# Patient Record
Sex: Female | Born: 1975 | Race: White | Hispanic: No | Marital: Single | Smoking: Never smoker
Health system: Southern US, Community
[De-identification: ages and names within clinical notes are randomized; demographics above are authoritative.]

---

## 2007-01-28 ENCOUNTER — Emergency Department (HOSPITAL_COMMUNITY): Admission: EM | Admit: 2007-01-28 | Discharge: 2007-01-28 | Payer: Self-pay | Admitting: Emergency Medicine

## 2007-02-06 ENCOUNTER — Emergency Department (HOSPITAL_COMMUNITY): Admission: EM | Admit: 2007-02-06 | Discharge: 2007-02-06 | Payer: Self-pay | Admitting: Emergency Medicine
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
## 2009-07-18 ENCOUNTER — Emergency Department: Payer: Self-pay | Admitting: Emergency Medicine

## 2009-10-06 ENCOUNTER — Ambulatory Visit: Payer: Self-pay | Admitting: Family Medicine

## 2011-01-05 LAB — URINE MICROSCOPIC-ADD ON

## 2011-01-05 LAB — COMPREHENSIVE METABOLIC PANEL
AST: 64 — ABNORMAL HIGH
Albumin: 4
BUN: 11
CO2: 25
Calcium: 9.3
Creatinine, Ser: 0.58
GFR calc non Af Amer: >60
Total Bilirubin: 0.4
Total Protein: 6.7

## 2011-01-05 LAB — ETHANOL: Alcohol, Ethyl (B): 97 — ABNORMAL HIGH

## 2011-01-05 LAB — CBC
MCV: 93.9
RDW: 13.2

## 2011-01-05 LAB — URINALYSIS, ROUTINE W REFLEX MICROSCOPIC
Leukocytes, UA: NEGATIVE
pH: 5.5

## 2011-01-05 LAB — I-STAT 8, (EC8 V) (CONVERTED LAB)
Acid-base deficit: 3 — ABNORMAL HIGH
BUN: 13
Bicarbonate: 22
Chloride: 107
Glucose, Bld: 85
HCT: 43
Hemoglobin: 14.6
Operator id: 196461
Potassium: 4
Sodium: 138
TCO2: 23
pCO2, Ven: 37.5 — ABNORMAL LOW
pH, Ven: 7.376 — ABNORMAL HIGH

## 2011-01-05 LAB — DIFFERENTIAL
Basophils Absolute: 0
Eosinophils Relative: 2
Monocytes Relative: 9
Neutro Abs: 7.1
Neutrophils Relative %: 64

## 2011-03-27 ENCOUNTER — Emergency Department: Payer: Self-pay | Admitting: Emergency Medicine

## 2013-07-05 ENCOUNTER — Emergency Department: Payer: Self-pay | Admitting: Emergency Medicine

## 2017-10-21 DIAGNOSIS — Z9882 Breast implant status: Secondary | ICD-10-CM | POA: Insufficient documentation

## 2019-01-12 DIAGNOSIS — Z9882 Breast implant status: Secondary | ICD-10-CM

## 2019-01-15 ENCOUNTER — Other Ambulatory Visit: Payer: Self-pay

## 2019-01-15 ENCOUNTER — Ambulatory Visit (LOCAL_COMMUNITY_HEALTH_CENTER): Payer: Self-pay | Admitting: Physician Assistant

## 2019-01-15 VITALS — BP 173/99 | Ht 66.0 in | Wt 187.0 lb

## 2019-01-15 DIAGNOSIS — I10 Essential (primary) hypertension: Secondary | ICD-10-CM

## 2019-01-15 DIAGNOSIS — Z3041 Encounter for surveillance of contraceptive pills: Secondary | ICD-10-CM

## 2019-01-15 DIAGNOSIS — Z3009 Encounter for other general counseling and advice on contraception: Secondary | ICD-10-CM

## 2019-01-15 DIAGNOSIS — Z30011 Encounter for initial prescription of contraceptive pills: Secondary | ICD-10-CM

## 2019-01-15 MED ORDER — NORETHINDRONE 0.35 MG PO TABS: 1.0000 | ORAL_TABLET | Freq: Every day | ORAL | 0 refills | Status: AC

## 2019-01-15 NOTE — Progress Notes (Signed)
Patient here for refill on Micronor. Reports only change in health hx is that she has started Prozac. Aileen Fass, RN

## 2019-01-16 ENCOUNTER — Encounter: Payer: Self-pay | Admitting: Physician Assistant
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
## 2020-07-31 ENCOUNTER — Ambulatory Visit: Payer: Self-pay
